# Patient Record
Sex: Female | Born: 1965
Health system: Southern US, Community
[De-identification: ages and names within clinical notes are randomized; demographics above are authoritative.]

## PROBLEM LIST (undated history)

## (undated) DIAGNOSIS — E7800 Pure hypercholesterolemia, unspecified: Secondary | ICD-10-CM

## (undated) DIAGNOSIS — I10 Essential (primary) hypertension: Secondary | ICD-10-CM

## (undated) DIAGNOSIS — F419 Anxiety disorder, unspecified: Secondary | ICD-10-CM

## (undated) HISTORY — PX: CHOLECYSTECTOMY: SHX55

---

## 2009-06-30 ENCOUNTER — Emergency Department (HOSPITAL_BASED_OUTPATIENT_CLINIC_OR_DEPARTMENT_OTHER): Admission: EM | Admit: 2009-06-30 | Discharge: 2009-06-30 | Payer: Self-pay | Admitting: Emergency Medicine

## 2009-06-30 ENCOUNTER — Ambulatory Visit: Payer: Self-pay | Admitting: Diagnostic Radiology

## 2010-05-25 LAB — COMPREHENSIVE METABOLIC PANEL WITH GFR
ALT: 283 U/L — ABNORMAL HIGH (ref 0–35)
AST: 193 U/L — ABNORMAL HIGH (ref 0–37)
Albumin: 4.5 g/dL (ref 3.5–5.2)
Alkaline Phosphatase: 90 U/L (ref 39–117)
BUN: 6 mg/dL (ref 6–23)
CO2: 28 meq/L (ref 19–32)
Calcium: 9.6 mg/dL (ref 8.4–10.5)
Chloride: 102 meq/L (ref 96–112)
Creatinine, Ser: 0.6 mg/dL (ref 0.4–1.2)
GFR calc non Af Amer: >60 mL/min
Glucose, Bld: 100 mg/dL — ABNORMAL HIGH (ref 70–99)
Potassium: 4.1 meq/L (ref 3.5–5.1)
Sodium: 143 meq/L (ref 135–145)
Total Bilirubin: 0.9 mg/dL (ref 0.3–1.2)
Total Protein: 8.9 g/dL — ABNORMAL HIGH (ref 6.0–8.3)

## 2010-05-25 LAB — DIFFERENTIAL
Basophils Absolute: 0.2 K/uL — ABNORMAL HIGH (ref 0.0–0.1)
Basophils Relative: 2 % — ABNORMAL HIGH (ref 0–1)
Eosinophils Absolute: 0.4 K/uL (ref 0.0–0.7)
Eosinophils Relative: 4 % (ref 0–5)
Lymphocytes Relative: 28 % (ref 12–46)
Lymphs Abs: 2.5 K/uL (ref 0.7–4.0)
Monocytes Absolute: 0.5 K/uL (ref 0.1–1.0)
Monocytes Relative: 6 % (ref 3–12)
Neutro Abs: 5.3 K/uL (ref 1.7–7.7)
Neutrophils Relative %: 60 % (ref 43–77)

## 2010-05-25 LAB — URINALYSIS, ROUTINE W REFLEX MICROSCOPIC
Bilirubin Urine: NEGATIVE
Glucose, UA: NEGATIVE mg/dL
Ketones, ur: NEGATIVE mg/dL
Urobilinogen, UA: 1 mg/dL (ref 0.0–1.0)
pH: 7.5 (ref 5.0–8.0)

## 2010-05-25 LAB — LIPASE, BLOOD: Lipase: 6969 U/L — ABNORMAL HIGH (ref 23–300)

## 2010-05-25 LAB — CBC
Platelets: UNDETERMINED 10*3/uL (ref 150–400)
WBC: 8.9 10*3/uL (ref 4.0–10.5)

## 2010-09-21 ENCOUNTER — Encounter: Payer: Self-pay | Admitting: Student

## 2010-09-21 ENCOUNTER — Emergency Department (HOSPITAL_BASED_OUTPATIENT_CLINIC_OR_DEPARTMENT_OTHER)
Admission: EM | Admit: 2010-09-21 | Discharge: 2010-09-21 | Disposition: A | Discharge status: Home / Self Care | Payer: BC Managed Care – PPO | Attending: Emergency Medicine | Admitting: Emergency Medicine

## 2010-09-21 DIAGNOSIS — R0602 Shortness of breath: Secondary | ICD-10-CM | POA: Insufficient documentation

## 2010-09-21 DIAGNOSIS — H9209 Otalgia, unspecified ear: Secondary | ICD-10-CM | POA: Insufficient documentation

## 2010-09-21 DIAGNOSIS — I10 Essential (primary) hypertension: Secondary | ICD-10-CM | POA: Insufficient documentation

## 2010-09-21 DIAGNOSIS — I889 Nonspecific lymphadenitis, unspecified: Secondary | ICD-10-CM | POA: Insufficient documentation

## 2010-09-21 DIAGNOSIS — H60399 Other infective otitis externa, unspecified ear: Secondary | ICD-10-CM

## 2010-09-21 DIAGNOSIS — J029 Acute pharyngitis, unspecified: Secondary | ICD-10-CM | POA: Insufficient documentation

## 2010-09-21 HISTORY — DX: Essential (primary) hypertension: I10

## 2010-09-21 HISTORY — DX: Anxiety disorder, unspecified: F41.9

## 2010-09-21 MED ORDER — NEOMYCIN-POLYMYXIN-HC 3.5-10000-1 OT SUSP: 3.0000 [drp] | Freq: Three times a day (TID) | OTIC | Status: AC

## 2010-09-21 MED ORDER — CEPHALEXIN 500 MG PO CAPS: 500.0000 mg | ORAL_CAPSULE | Freq: Three times a day (TID) | ORAL | Status: AC

## 2010-09-21 MED ORDER — HYDROCODONE-ACETAMINOPHEN 5-325 MG PO TABS: 1.0000 | ORAL_TABLET | ORAL | Status: AC | PRN

## 2010-09-21 MED ORDER — IBUPROFEN 800 MG PO TABS
800.0000 mg | ORAL_TABLET | Freq: Once | ORAL | Status: AC
  Administered 2010-09-21: 800 mg via ORAL
  Filled 2010-09-21: qty 1

## 2010-09-21 MED ORDER — CEPHALEXIN 250 MG PO CAPS
500.0000 mg | ORAL_CAPSULE | Freq: Once | ORAL | Status: AC
  Administered 2010-09-21: 500 mg via ORAL
  Filled 2010-09-21: qty 2

## 2010-09-21 MED ORDER — CEPHALEXIN 500 MG PO CAPS: 500.0000 mg | ORAL_CAPSULE | Freq: Four times a day (QID) | ORAL | Status: DC

## 2010-09-21 NOTE — ED Notes (Signed)
Pt in with c/o Right ear ache , pain , pressure radiating to right side of next with associated SOB and possible anxiety and palpitations x 1 week off and on. Denies N V D CP LOC

## 2010-09-21 NOTE — ED Provider Notes (Signed)
History     Chief Complaint  Patient presents with  . Shortness of Breath  . Otalgia    right ear   Patient is a 45 y.o. female presenting with ear pain. The history is provided by the patient.  Otalgia This is a new problem. The current episode started 2 days ago. There is pain in the right ear. The problem occurs constantly. The problem has not changed since onset.There has been no fever. The pain is at a severity of 6/10. The pain is moderate. Associated symptoms include sore throat. Pertinent negatives include no ear discharge, no headaches, no hearing loss, no rhinorrhea and no cough. Associated symptoms comments: Pain radiates to the right side of the neck. She had a brief episode of dyspnea initially, but none since two years ago..    Past Medical History  Diagnosis Date  . Anxiety   . Hypertension     Past Surgical History  Procedure Date  . Cholecystectomy     History reviewed. No pertinent family history.  History  Substance Use Topics  . Smoking status: Never Smoker   . Smokeless tobacco: Not on file  . Alcohol Use: No    OB History    Grav Para Term Preterm Abortions TAB SAB Ect Mult Living                  Review of Systems  HENT: Positive for ear pain and sore throat. Negative for hearing loss, rhinorrhea and ear discharge.   Respiratory: Positive for shortness of breath. Negative for cough.   Neurological: Negative for headaches.  All other systems reviewed and are negative.    Physical Exam  BP 133/99  Pulse 78  Temp(Src) 99.4 F (37.4 C) (Oral)  Resp 20  Wt 155 lb (70.308 kg)  SpO2 100%  Physical Exam  Nursing note and vitals reviewed. Constitutional: She is oriented to person, place, and time. She appears well-developed and well-nourished.  HENT:  Head: Normocephalic and atraumatic.  Left Ear: External ear normal.  Mouth/Throat: Oropharynx is clear and moist.       There is pain elicited when tension is applied to the helix of the  right ear. There is mild preauricular and postauricular tenderness. The right external auditory canal is mildly erythematous, right TM is normal.  Eyes: Conjunctivae and EOM are normal. Pupils are equal, round, and reactive to light.  Neck: Normal range of motion. Neck supple.       Mild right posterior cervical adenopathy.  Cardiovascular: Normal rate and normal heart sounds.   No murmur heard. Abdominal: Soft. Bowel sounds are normal. She exhibits no mass. There is no tenderness.  Musculoskeletal: Normal range of motion. She exhibits no edema.  Lymphadenopathy:    She has cervical adenopathy.  Neurological: She is alert and oriented to person, place, and time. A cranial nerve deficit is present. Coordination normal.  Skin: Skin is warm and dry. No rash noted.  Psychiatric: She has a normal mood and affect.    ED Course  Procedures  MDM Clinically mild otitis externa.      Dione Booze, MD 09/21/10 2258

## 2015-08-02 ENCOUNTER — Emergency Department (HOSPITAL_BASED_OUTPATIENT_CLINIC_OR_DEPARTMENT_OTHER): Payer: BC Managed Care – PPO

## 2015-08-02 ENCOUNTER — Emergency Department (HOSPITAL_BASED_OUTPATIENT_CLINIC_OR_DEPARTMENT_OTHER)
Admission: EM | Admit: 2015-08-02 | Discharge: 2015-08-02 | Disposition: A | Discharge status: Home / Self Care | Payer: BC Managed Care – PPO | Attending: Emergency Medicine | Admitting: Emergency Medicine

## 2015-08-02 ENCOUNTER — Encounter (HOSPITAL_BASED_OUTPATIENT_CLINIC_OR_DEPARTMENT_OTHER): Payer: Self-pay | Admitting: Emergency Medicine

## 2015-08-02 DIAGNOSIS — Z79899 Other long term (current) drug therapy: Secondary | ICD-10-CM | POA: Diagnosis not present

## 2015-08-02 DIAGNOSIS — R1031 Right lower quadrant pain: Secondary | ICD-10-CM

## 2015-08-02 DIAGNOSIS — I10 Essential (primary) hypertension: Secondary | ICD-10-CM | POA: Insufficient documentation

## 2015-08-02 LAB — CBC
HCT: 37.2 % (ref 36.0–46.0)
HEMOGLOBIN: 12.7 g/dL (ref 12.0–15.0)
MCH: 30.3 pg (ref 26.0–34.0)
MCHC: 34.1 g/dL (ref 30.0–36.0)
MCV: 88.8 fL (ref 78.0–100.0)
Platelets: ADEQUATE 10*3/uL (ref 150–400)
RBC: 4.19 MIL/uL (ref 3.87–5.11)
RDW: 12.5 % (ref 11.5–15.5)
WBC: 6.7 10*3/uL (ref 4.0–10.5)

## 2015-08-02 LAB — URINALYSIS, ROUTINE W REFLEX MICROSCOPIC
BILIRUBIN URINE: NEGATIVE
GLUCOSE, UA: NEGATIVE mg/dL
HGB URINE DIPSTICK: NEGATIVE
Ketones, ur: NEGATIVE mg/dL
Leukocytes, UA: NEGATIVE
NITRITE: NEGATIVE
Protein, ur: NEGATIVE mg/dL
SPECIFIC GRAVITY, URINE: 1.016 (ref 1.005–1.030)
pH: 5.5 (ref 5.0–8.0)

## 2015-08-02 LAB — COMPREHENSIVE METABOLIC PANEL
ALK PHOS: 99 U/L (ref 38–126)
ALT: 38 U/L (ref 14–54)
ANION GAP: 10 (ref 5–15)
AST: 31 U/L (ref 15–41)
Albumin: 4.4 g/dL (ref 3.5–5.0)
BILIRUBIN TOTAL: 0.6 mg/dL (ref 0.3–1.2)
BUN: 11 mg/dL (ref 6–20)
CALCIUM: 9.8 mg/dL (ref 8.9–10.3)
CO2: 28 mmol/L (ref 22–32)
Chloride: 99 mmol/L — ABNORMAL LOW (ref 101–111)
Creatinine, Ser: 0.59 mg/dL (ref 0.44–1.00)
Glucose, Bld: 94 mg/dL (ref 65–99)
Potassium: 3.8 mmol/L (ref 3.5–5.1)
SODIUM: 137 mmol/L (ref 135–145)
TOTAL PROTEIN: 8.5 g/dL — AB (ref 6.5–8.1)

## 2015-08-02 LAB — PREGNANCY, URINE: PREG TEST UR: NEGATIVE

## 2015-08-02 LAB — WET PREP, GENITAL
Sperm: NONE SEEN
Trich, Wet Prep: NONE SEEN
Yeast Wet Prep HPF POC: NONE SEEN

## 2015-08-02 LAB — LIPASE, BLOOD: Lipase: 18 U/L (ref 11–51)

## 2015-08-02 MED ORDER — IOPAMIDOL (ISOVUE-300) INJECTION 61%
100.0000 mL | Freq: Once | INTRAVENOUS | Status: AC | PRN
  Administered 2015-08-02: 100 mL via INTRAVENOUS

## 2015-08-02 MED ORDER — MORPHINE SULFATE (PF) 4 MG/ML IV SOLN
4.0000 mg | Freq: Once | INTRAVENOUS | Status: DC
  Filled 2015-08-02: qty 1

## 2015-08-02 MED ORDER — METRONIDAZOLE 500 MG PO TABS: 500.0000 mg | ORAL_TABLET | Freq: Two times a day (BID) | ORAL | Status: AC

## 2015-08-02 MED ORDER — ONDANSETRON HCL 4 MG/2ML IJ SOLN
4.0000 mg | Freq: Once | INTRAMUSCULAR | Status: DC
  Filled 2015-08-02: qty 2

## 2015-08-02 MED ORDER — SODIUM CHLORIDE 0.9 % IV BOLUS (SEPSIS)
1000.0000 mL | Freq: Once | INTRAVENOUS | Status: AC
  Administered 2015-08-02: 1000 mL via INTRAVENOUS

## 2015-08-02 MED ORDER — IOPAMIDOL (ISOVUE-300) INJECTION 61%: 100.0000 mL | Freq: Once | INTRAVENOUS | Status: DC | PRN

## 2015-08-02 NOTE — ED Provider Notes (Signed)
Complains of nonradiating dull right lower quadrant abdominal pain gradual onset 3 days ago. Pain is improved with eating however she does have diminished appetite for the past 3 days. Pain not made worse with anything She denies fever denies nausea or vomiting pain is mild at present she denies vaginal discharge denies urinary symptoms. On exam alert no distress lungs clear auscultation heart regular rate and rhythm abdomen nondistended normal active bowel sounds tender at right lower quadrant without guarding rigidity or rebound  Doug SouSam Thresea Doble, MD 08/02/15 1535

## 2015-08-02 NOTE — ED Provider Notes (Signed)
CSN: [id number]     Arrival date & time 08/02/15  1354 History   First MD Initiated Contact with Patient 08/02/15 1436     Chief Complaint  Patient presents with  . right side pain    HPI  Crystal Ross is a 50 y.o. female PMH significant for anxiety, HTN presenting with RLQ pain since Thursday. She describes her pain as intermittent, 5/10 pain scale, radiating intermittently to periumbilical, dull. She admits to decreased appetite. Pain improved with laying on either of her sides and eating. She denies fevers, chills, N/V, changes in bowel/bladder habits, urinary/vaginal complaints.   Past Medical History  Diagnosis Date  . Anxiety   . Hypertension    Past Surgical History  Procedure Laterality Date  . Cholecystectomy     History reviewed. No pertinent family history. Social History  Substance Use Topics  . Smoking status: Never Smoker   . Smokeless tobacco: None  . Alcohol Use: No   OB History    No data available     Review of Systems  Ten systems are reviewed and are negative for acute change except as noted in the HPI  Allergies  Review of patient's allergies indicates no known allergies.  Home Medications   Prior to Admission medications   Medication Sig Start Date End Date Taking? Authorizing Provider  valsartan-hydrochlorothiazide (DIOVAN-HCT) 160-12.5 MG per tablet Take 1 tablet by mouth daily.      Historical Provider, MD   BP 152/96 mmHg  Pulse 75  Temp(Src) 98.8 F (37.1 C) (Oral)  Resp 18  Ht  (1.626 m)  Wt 78.019 kg  BMI 29.51 kg/m2  SpO2 100% Physical Exam  Constitutional: She appears well-developed and well-nourished. No distress.  HENT:  Head: Normocephalic and atraumatic.  Mouth/Throat: Oropharynx is clear and moist. No oropharyngeal exudate.  Eyes: Conjunctivae are normal. Pupils are equal, round, and reactive to light. Right eye exhibits no discharge. Left eye exhibits no discharge. No scleral icterus.  Neck: No tracheal deviation  present.  Cardiovascular: Normal rate, regular rhythm, normal heart sounds and intact distal pulses.  Exam reveals no gallop and no friction rub.   No murmur heard. Pulmonary/Chest: Effort normal and breath sounds normal. No respiratory distress. She has no wheezes. She has no rales. She exhibits no tenderness.  Abdominal: Soft. Bowel sounds are normal. She exhibits no distension and no mass. There is tenderness. There is guarding. There is no rebound.  Positive McBurneys and Rosvings.   Genitourinary:  Chaperoned pelvic exam: normal external genitalia, vulva, vagina, cervix, uterus and adnexa.   Musculoskeletal: She exhibits no edema.  Lymphadenopathy:    She has no cervical adenopathy.  Neurological: She is alert. Coordination normal.  Skin: Skin is warm and dry. No rash noted. She is not diaphoretic. No erythema.  Psychiatric: She has a normal mood and affect. Her behavior is normal.  Nursing note and vitals reviewed.  ED Course  Procedures  Labs Review Labs Reviewed  COMPREHENSIVE METABOLIC PANEL - Abnormal; Notable for the following:    Chloride 99 (*)    Total Protein 8.5 (*)    All other components within normal limits  WET PREP, GENITAL  LIPASE, BLOOD  CBC  URINALYSIS, ROUTINE W REFLEX MICROSCOPIC (NOT AT [facility])  PREGNANCY, URINE  GC/CHLAMYDIA PROBE AMP ([place]) NOT AT [facility]   Imaging Review Ct Abdomen Pelvis W Contrast  08/02/2015  CLINICAL DATA:  Right lower  quadrant tenderness with palpation. EXAM: CT ABDOMEN AND PELVIS WITH CONTRAST  TECHNIQUE: Multidetector CT imaging of the abdomen and pelvis was performed using the standard protocol following bolus administration of intravenous contrast. CONTRAST:  100mL ISOVUE-300 IOPAMIDOL (ISOVUE-300) INJECTION 61% COMPARISON:  Abdominal ultrasound of 06/30/2009. FINDINGS: Lower chest: Clear lung bases. Mild cardiomegaly, without pericardial or pleural effusion. Hepatobiliary: There may be mild hepatic steatosis. No focal liver  lesion. Variant lateral segment left liver lobe extends left upper quadrant. Cholecystectomy, without biliary ductal dilatation. Pancreas: Normal, without mass or ductal dilatation. Spleen: Normal in size, without focal abnormality. Adrenals/Urinary Tract: Normal adrenal glands. Normal kidneys, without hydronephrosis. Normal urinary bladder. Stomach/Bowel: Normal stomach, without wall thickening. Normal colon, appendix, and terminal ileum. Normal small bowel. Vascular/Lymphatic: Aortic and branch vessel atherosclerosis. No abdominopelvic adenopathy. Reproductive: Normal uterus and adnexa. Other: No significant free fluid. Musculoskeletal: No acute osseous abnormality. Mild convex left lumbar spine curvature. IMPRESSION: No acute process or explanation for right lower quadrant pain. Electronically Signed   By: Jeronimo GreavesKyle  Talbot M.D.   On: 08/02/2015 16:51   I have personally reviewed and evaluated these images and lab results as part of my medical decision-making.  MDM   Final diagnoses:  RLQ abdominal pain   Patient nontoxic appearing, VSS.  CT abd/pelv, lipase, CMP, CBC, UA, hcg unremarkable. Less likely etiologies include appendicitis, Meckel's diverticulum, ruptured ectopic pregnancy, ovarian torsion, ovarian cyst, PID/TOA, kidney stone, psoas abscess. Bacterial vaginosis on wet prep. GC/Chlamydia test still pending. Pelvic exam was unremarkable for adnexal tenderness. Will hold off on treating empirically, as patient denies being sexually active and denies any vaginal or urinary complaints. Patient may be safely discharged home. Discussed reasons for return. Patient to follow-up with primary care provider within one week. Patient in understanding and agreement with the plan.     Melton KrebsSamantha Nicole Ramsay Bognar, PA-C 08/02/15 1729  Doug SouSam Jacubowitz, MD 08/04/15 [phone]

## 2015-08-02 NOTE — ED Notes (Signed)
Pt c/o RLQ pain since Thursday, states pain is nonradiating and describes pain as dull, denies urinary symptoms, denies N/V, denies vaginal discharge. Pt states pain gets worse when she eats.

## 2015-08-02 NOTE — ED Notes (Signed)
Patient states that she has had had a pain and a bump to her right side  / abdominal / pelvic region since Thursday. Denies any N/V/D

## 2015-08-02 NOTE — Discharge Instructions (Signed)
Ms. Crystal Ross,  Nice meeting you! Please follow-up with your primary care provider. Return to the emergency department if you develop fevers, chills, increased abdominal pain, nausea/vomiting, new/worsening symptoms. Feel better soon!  S. Lane HackerNicole Seth Higginbotham, PA-C Abdominal Pain, Adult Many things can cause abdominal pain. Usually, abdominal pain is not caused by a disease and will improve without treatment. It can often be observed and treated at home. Your health care provider will do a physical exam and possibly order blood tests and X-rays to help determine the seriousness of your pain. However, in many cases, more time must pass before a clear cause of the pain can be found. Before that point, your health care provider may not know if you need more testing or further treatment. HOME CARE INSTRUCTIONS Monitor your abdominal pain for any changes. The following actions may help to alleviate any discomfort you are experiencing:  Only take over-the-counter or prescription medicines as directed by your health care provider.  Do not take laxatives unless directed to do so by your health care provider.  Try a clear liquid diet (broth, tea, or water) as directed by your health care provider. Slowly move to a bland diet as tolerated. SEEK MEDICAL CARE IF:  You have unexplained abdominal pain.  You have abdominal pain associated with nausea or diarrhea.  You have pain when you urinate or have a bowel movement.  You experience abdominal pain that wakes you in the night.  You have abdominal pain that is worsened or improved by eating food.  You have abdominal pain that is worsened with eating fatty foods.  You have a fever. SEEK IMMEDIATE MEDICAL CARE IF:  Your pain does not go away within 2 hours.  You keep throwing up (vomiting).  Your pain is felt only in portions of the abdomen, such as the right side or the left lower portion of the abdomen.  You pass bloody or black tarry  stools. MAKE SURE YOU:  Understand these instructions.  Will watch your condition.  Will get help right away if you are not doing well or get worse.   This information is not intended to replace advice given to you by your health care provider. Make sure you discuss any questions you have with your health care provider.   Document Released: 12/01/2004 Document Revised: 11/12/2014 Document Reviewed: 10/31/2012 Elsevier Interactive Patient Education [employer]2016 Elsevier Inc.

## 2015-08-04 LAB — GC/CHLAMYDIA PROBE AMP (~~LOC~~) NOT AT ARMC
Chlamydia: NEGATIVE
Neisseria Gonorrhea: NEGATIVE

## 2016-05-05 ENCOUNTER — Encounter (HOSPITAL_BASED_OUTPATIENT_CLINIC_OR_DEPARTMENT_OTHER): Payer: Self-pay | Admitting: Emergency Medicine

## 2016-05-05 ENCOUNTER — Emergency Department (HOSPITAL_BASED_OUTPATIENT_CLINIC_OR_DEPARTMENT_OTHER): Payer: BC Managed Care – PPO

## 2016-05-05 ENCOUNTER — Emergency Department (HOSPITAL_BASED_OUTPATIENT_CLINIC_OR_DEPARTMENT_OTHER)
Admission: EM | Admit: 2016-05-05 | Discharge: 2016-05-05 | Disposition: A | Discharge status: Home / Self Care | Payer: BC Managed Care – PPO | Source: Home / Self Care

## 2016-05-05 ENCOUNTER — Emergency Department (HOSPITAL_BASED_OUTPATIENT_CLINIC_OR_DEPARTMENT_OTHER)
Admission: EM | Admit: 2016-05-05 | Discharge: 2016-05-05 | Disposition: A | Discharge status: Home / Self Care | Payer: BC Managed Care – PPO | Attending: Emergency Medicine | Admitting: Emergency Medicine

## 2016-05-05 DIAGNOSIS — R0789 Other chest pain: Secondary | ICD-10-CM | POA: Diagnosis not present

## 2016-05-05 DIAGNOSIS — Z79899 Other long term (current) drug therapy: Secondary | ICD-10-CM

## 2016-05-05 DIAGNOSIS — Z5321 Procedure and treatment not carried out due to patient leaving prior to being seen by health care provider: Secondary | ICD-10-CM

## 2016-05-05 DIAGNOSIS — I10 Essential (primary) hypertension: Secondary | ICD-10-CM | POA: Insufficient documentation

## 2016-05-05 DIAGNOSIS — R079 Chest pain, unspecified: Secondary | ICD-10-CM | POA: Insufficient documentation

## 2016-05-05 HISTORY — DX: Pure hypercholesterolemia, unspecified: E78.00

## 2016-05-05 LAB — CBC
HCT: 36.3 % (ref 36.0–46.0)
Hemoglobin: 12.3 g/dL (ref 12.0–15.0)
MCH: 30.3 pg (ref 26.0–34.0)
MCHC: 33.9 g/dL (ref 30.0–36.0)
MCV: 89.4 fL (ref 78.0–100.0)
PLATELETS: 357 10*3/uL (ref 150–400)
RBC: 4.06 MIL/uL (ref 3.87–5.11)
RDW: 12.3 % (ref 11.5–15.5)
WBC: 5.7 10*3/uL (ref 4.0–10.5)

## 2016-05-05 LAB — BASIC METABOLIC PANEL
Anion gap: 6 (ref 5–15)
BUN: 10 mg/dL (ref 6–20)
CHLORIDE: 100 mmol/L — AB (ref 101–111)
CO2: 33 mmol/L — AB (ref 22–32)
CREATININE: 0.67 mg/dL (ref 0.44–1.00)
Calcium: 9.4 mg/dL (ref 8.9–10.3)
GFR calc Af Amer: >60 mL/min (ref >60)
GFR calc non Af Amer: >60 mL/min (ref >60)
GLUCOSE: 98 mg/dL (ref 65–99)
Potassium: 3.8 mmol/L (ref 3.5–5.1)
Sodium: 139 mmol/L (ref 135–145)

## 2016-05-05 LAB — D-DIMER, QUANTITATIVE: D-Dimer, Quant: 2.05 ug/mL-FEU — ABNORMAL HIGH (ref 0.00–0.50)

## 2016-05-05 LAB — TROPONIN I: Troponin I: <0.03 ng/mL (ref <0.03)

## 2016-05-05 MED ORDER — KETOROLAC TROMETHAMINE 30 MG/ML IJ SOLN
30.0000 mg | Freq: Once | INTRAMUSCULAR | Status: AC
  Administered 2016-05-05: 30 mg via INTRAVENOUS
  Filled 2016-05-05: qty 1

## 2016-05-05 MED ORDER — IOPAMIDOL (ISOVUE-370) INJECTION 76%
100.0000 mL | Freq: Once | INTRAVENOUS | Status: AC | PRN
  Administered 2016-05-05: 100 mL via INTRAVENOUS

## 2016-05-05 MED ORDER — NAPROXEN 500 MG PO TABS: 500.0000 mg | ORAL_TABLET | Freq: Two times a day (BID) | ORAL | 0 refills | Status: AC

## 2016-05-05 MED ORDER — HYDROCODONE-ACETAMINOPHEN 5-325 MG PO TABS: 1.0000 | ORAL_TABLET | Freq: Four times a day (QID) | ORAL | 0 refills | Status: AC | PRN

## 2016-05-05 MED FILL — HYDROCODON-APAP 5-325: 5-325 | 2 days supply | Qty: 10 | Fill #0

## 2016-05-05 MED FILL — NAPROXEN 500 MG TABLET: 500 | 8 days supply | Qty: 15 | Fill #0

## 2016-05-05 NOTE — ED Notes (Signed)
Patient transported to X-ray 

## 2016-05-05 NOTE — ED Triage Notes (Signed)
Pt with continued chest pain. Recently seen here this morning after full workup states "the pain is just there, when I took the pain medication the first time it eased off."  Denies N/V. A/O at triage NAD.

## 2016-05-05 NOTE — Discharge Instructions (Signed)
Naproxen as prescribed. Hydrococone as prescribed as needed for pain not relieved with naproxen.  Return to the emergency department if you develop worsening pain, difficulty breathing, high fevers, productive cough, or other new and concerning symptoms.

## 2016-05-05 NOTE — ED Notes (Signed)
ED Provider at bedside. 

## 2016-05-05 NOTE — ED Provider Notes (Signed)
[facility] Provider [facility]   CSN: [id number] Arrival date & time: 05/05/16  0712     History   Chief Complaint Chief Complaint  Patient presents with  . Chest Pain    HPI Crystal Ross is a 51 y.o. female.  Patient is a 51 year old female with history of hypertension. She presents for evaluation of chest discomfort. This started 2 days ago in the absence of any injury or trauma. Her pain is sharp in nature and located in the front of her chest. It radiates around to the left side and under her left breast. It is worse with deep breathing and relieved with rest. She denies any fevers, chills, or cough.  She has no prior cardiac history, but does report having had a prior stress test years ago which was unremarkable.   The history is provided by the patient.  Chest Pain   This is a new problem. The current episode started 2 days ago. The problem occurs constantly. The problem has not changed since onset.The pain is associated with movement and breathing. The pain is present in the lateral region and substernal region. The pain is moderate. The quality of the pain is described as sharp. The pain does not radiate. The symptoms are aggravated by deep breathing. Pertinent negatives include no cough, no diaphoresis, no fever, no palpitations, no shortness of breath and no sputum production. She has tried nothing for the symptoms. The treatment provided no relief.    Past Medical History:  Diagnosis Date  . Anxiety   . Hypercholesteremia   . Hypertension     There are no active problems to display for this patient.   Past Surgical History:  Procedure Laterality Date  . CHOLECYSTECTOMY      OB History    No data available       Home Medications    Prior to Admission medications   Medication Sig Start Date End Date Taking? Authorizing Provider  metroNIDAZOLE (FLAGYL) 500 MG tablet Take 1 tablet (500 mg total) by mouth 2 (two) times daily. 08/02/15   Melton KrebsSamantha  Nicole Riley, PA-C  valsartan-hydrochlorothiazide (DIOVAN-HCT) 160-12.5 MG per tablet Take 1 tablet by mouth daily.      Historical Provider, MD    Family History History reviewed. No pertinent family history.  Social History Social History  Substance Use Topics  . Smoking status: Never Smoker  . Smokeless tobacco: Never Used  . Alcohol use No     Allergies   Patient has no known allergies.   Review of Systems Review of Systems  Constitutional: Negative for diaphoresis and fever.  Respiratory: Negative for cough, sputum production and shortness of breath.   Cardiovascular: Positive for chest pain. Negative for palpitations.  All other systems reviewed and are negative.    Physical Exam Updated Vital Signs There were no vitals taken for this visit.  Physical Exam  Constitutional: She is oriented to person, place, and time. She appears well-developed and well-nourished. No distress.  HENT:  Head: Normocephalic and atraumatic.  Neck: Normal range of motion. Neck supple.  Cardiovascular: Normal rate and regular rhythm.  Exam reveals no gallop and no friction rub.   No murmur heard. Pulmonary/Chest: Effort normal and breath sounds normal. No respiratory distress. She has no wheezes.  Abdominal: Soft. Bowel sounds are normal. She exhibits no distension. There is no tenderness.  Musculoskeletal: Normal range of motion. She exhibits no edema.  There is swelling or tenderness. Homans sign is absent bilaterally.  Neurological:  She is alert and oriented to person, place, and time.  Skin: Skin is warm and dry. She is not diaphoretic.  Nursing note and vitals reviewed.    ED Treatments / Results  Labs (all labs ordered are listed, but only abnormal results are displayed) Labs Reviewed  BASIC METABOLIC PANEL  CBC  TROPONIN I  D-DIMER, QUANTITATIVE (NOT AT [facility])    EKG  EKG Interpretation  Date/Time:  Thursday May 05 2016 07:24:30 EST Ventricular Rate:  77 PR  Interval:    QRS Duration: 100 QT Interval:  418 QTC Calculation: 474 R Axis:   -78 Text Interpretation:  Sinus rhythm LAE, consider biatrial enlargement Left anterior fascicular block Abnormal R-wave progression, late transition Nonspecific T abnrm, anterolateral leads Baseline wander in lead(s) III V6 Confirmed by Langston Tuberville  MD, Merlinda Wrubel ([id number]) on 05/05/2016 7:36:33 AM       Radiology No results found.  Procedures Procedures (including critical care time)  Medications Ordered in ED Medications  ketorolac (TORADOL) 30 MG/ML injection 30 mg (not administered)     Initial Impression / Assessment and Plan / ED Course  I have reviewed the triage vital signs and the nursing notes.  Pertinent labs & imaging results that were available during my care of the patient were reviewed by me and considered in my medical decision making (see chart for details).  Patient presents with complaints of pleuritic left-sided chest pain that started yesterday. Her d-dimer is elevated, however there is no evidence for pulmonary embolism on her CT scan. Cardiac workup was also unremarkable. I found no emergent cause of her discomfort and I suspect this is most likely musculoskeletal in nature. She will be treated with naproxen, pain medication, and is to follow-up as needed if not improving. To return if worsening.  Final Clinical Impressions(s) / ED Diagnoses   Final diagnoses:  None    New Prescriptions New Prescriptions   No medications on file     Geoffery Lyons, MD 05/05/16 [phone]

## 2016-05-05 NOTE — ED Notes (Signed)
ED Provider at bedside to discuss findings and discharge instructions.

## 2016-05-05 NOTE — ED Triage Notes (Signed)
Pt having central chest pain radiating to left breast to back and left shoulder for two days.  Some difficulty taking a deep breath due to pain.  Pt had some dizziness and diaphoresis yesterday.

## 2016-05-05 NOTE — ED Notes (Signed)
Per registration, pt is leaving and is going to follow up with her PCP tomorrow.

## 2018-08-13 IMAGING — CT CT ANGIO CHEST
2 of 8 series · 18 of 36 positions shown · IV contrast (isovue)
Comparison: Chest radiographs 4884 hours today. CT Abdomen and
Pelvis 08/02/2015

CLINICAL DATA: 50-year-old female with central chest pain radiating
to the left breast, back and left shoulder for 2 days. Pleuritic
pain. Dizziness and diaphoresis yesterday. Initial encounter.

EXAM:
CT ANGIOGRAPHY CHEST WITH CONTRAST
TECHNIQUE: Multidetector CT imaging of the chest was performed using the
standard protocol during bolus administration of intravenous
contrast. Multiplanar CT image reconstructions and MIPs were
obtained to evaluate the vascular anatomy.
CONTRAST:  80 mL Isovue 370

[Series 7: pe coronal mpr · coronal · 0.56mm/px · 1 of 110 slices shown]
[im 55/110  mediastinal]
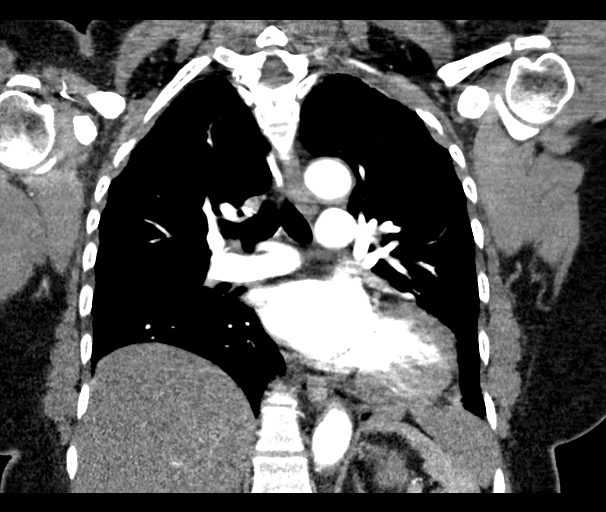

[Series 11: pe thins · axial · 0.80mm/px · z∈[-228,+10]mm · 17 of 266 slices shown]
[im 14/266  lung]
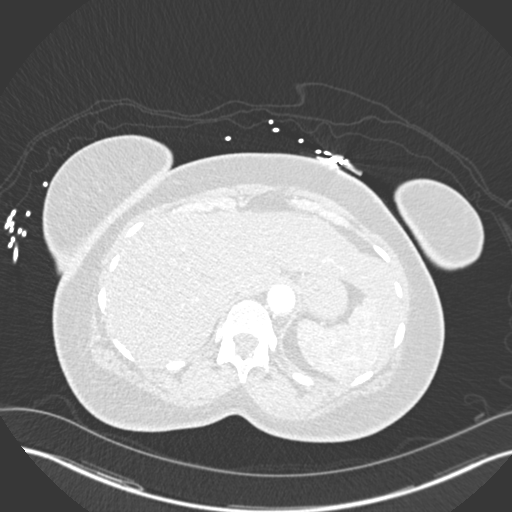
[im 28/266  mediastinal]
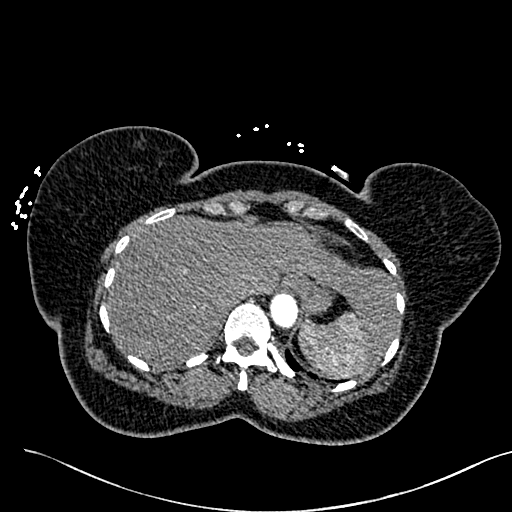
[im 42/266  lung]
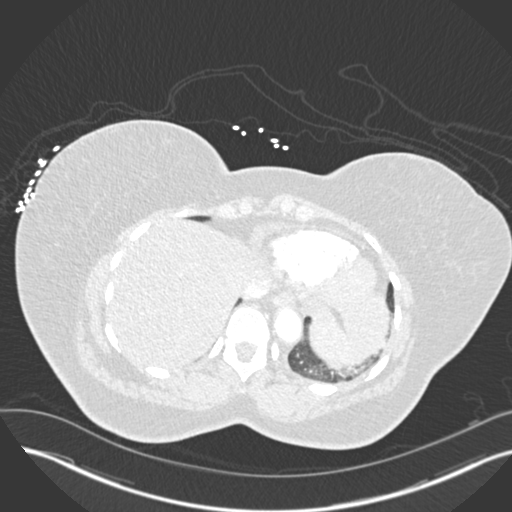
[im 56/266  mediastinal]
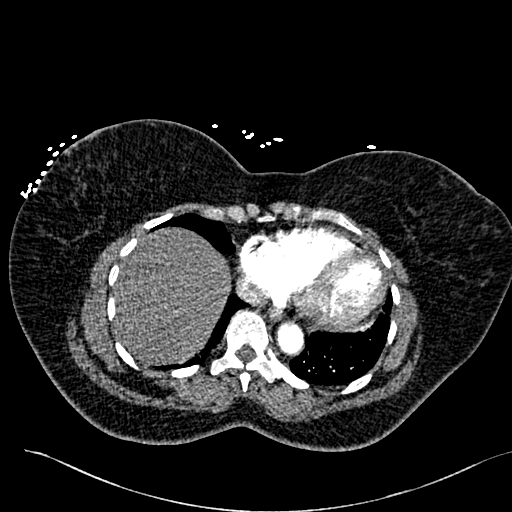
[im 70/266  lung]
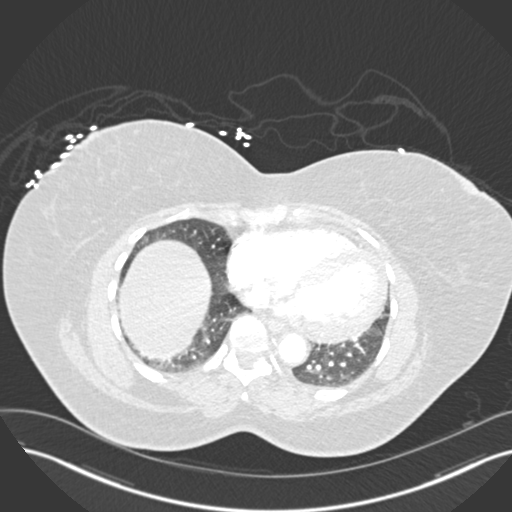
[im 84/266  mediastinal]
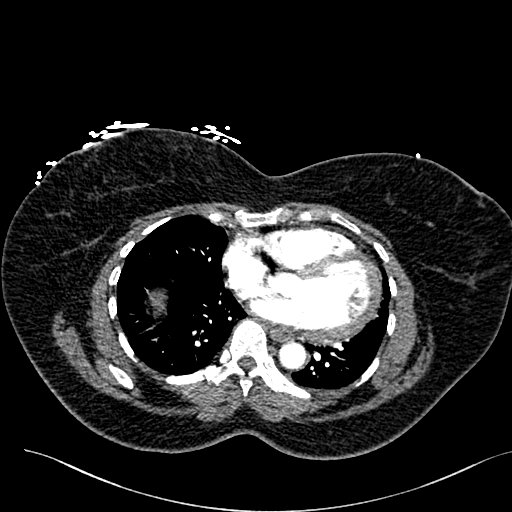
[im 98/266  lung]
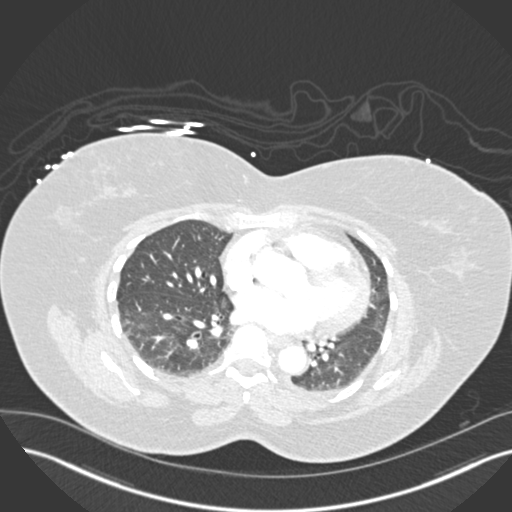
[im 112/266  mediastinal]
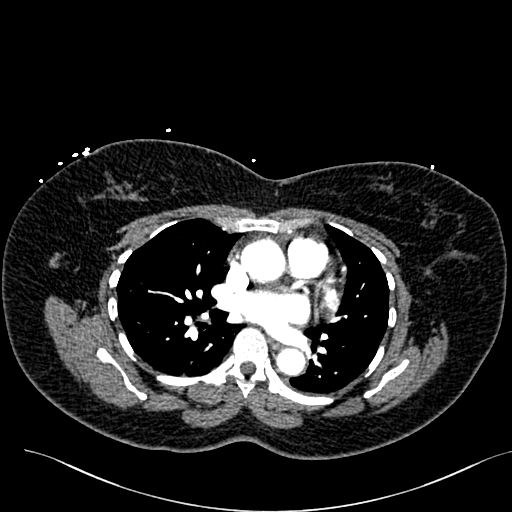
[im 140/266  lung]
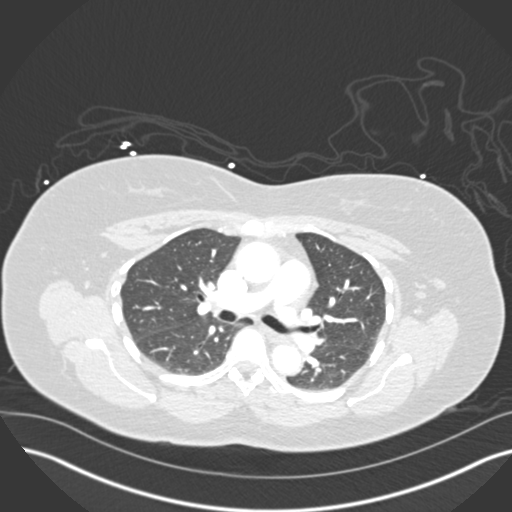
[im 154/266  mediastinal]
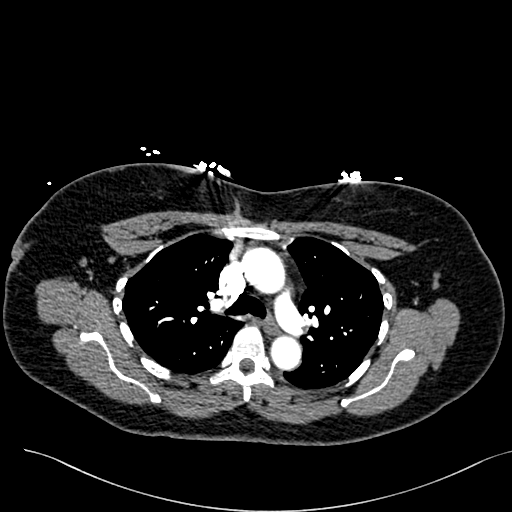
[im 168/266  lung]
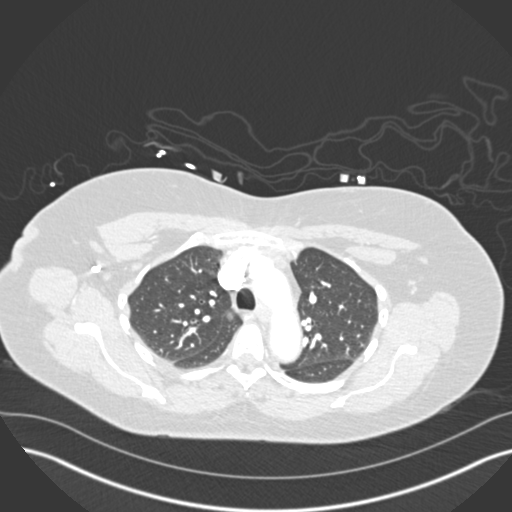
[im 182/266  mediastinal]
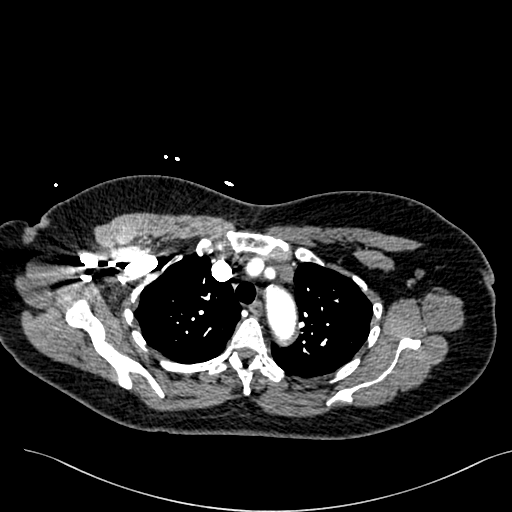
[im 196/266  lung]
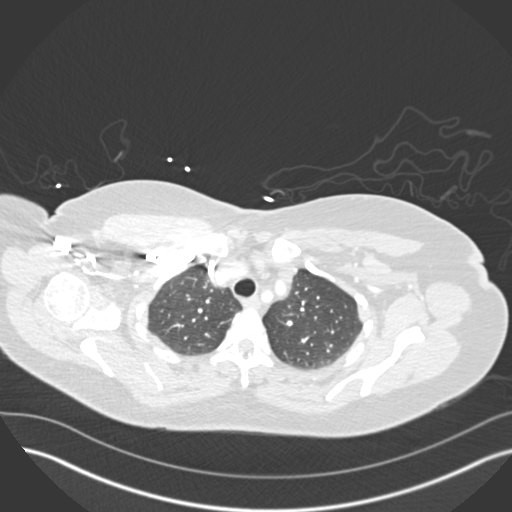
[im 210/266  mediastinal]
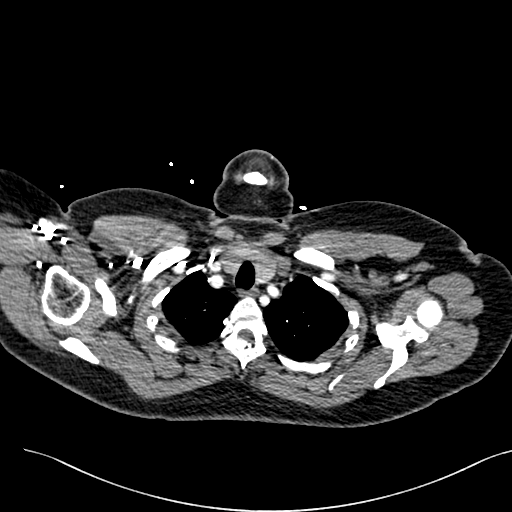
[im 224/266  lung]
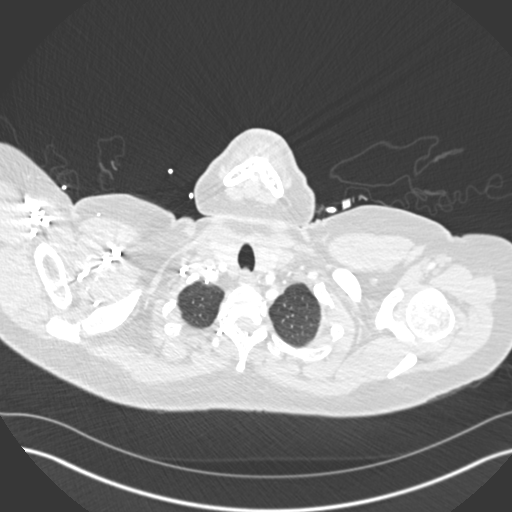
[im 238/266  mediastinal]
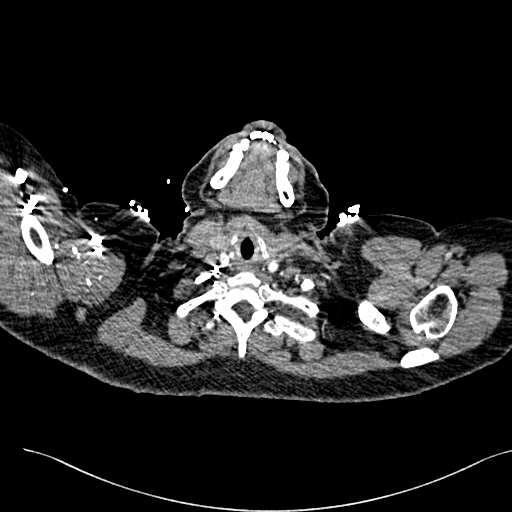
[im 252/266  lung]
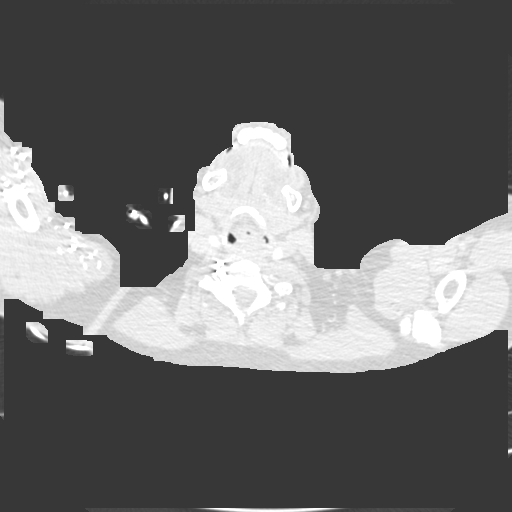

[18 of 36 positions shown; findings below may reference images not displayed]

FINDINGS: Cardiovascular: Good contrast bolus timing in the pulmonary arterial
tree.

No focal filling defect identified in the pulmonary arteries to
suggest acute pulmonary embolism.

Negative visible aorta. No calcified coronary artery atherosclerosis
is evident. Mild cardiomegaly (series 5, image 63), but no
pericardial effusion.

Mediastinum/Nodes: Negative.  No lymphadenopathy.

Negative visualized thoracic inlet and lower neck. No axillary
lymphadenopathy.

Lungs/Pleura: Low lung volumes with mild perihilar atelectasis.
Major airways are patent. Mild atelectasis at both lung bases. No
pleural effusion or other abnormal pulmonary opacity.

Upper Abdomen: Stable visualized upper abdominal viscera including
somewhat elongated left hepatic lobe of the liver (normal variant).

Musculoskeletal: Incidental paravertebral venous reflux of contrast
in the upper thoracic spine (series 8, image 85). Minimal levoconvex
upper thoracic scoliosis. Otherwise normal visualized osseous
structures.

Review of the MIP images confirms the above findings.
IMPRESSION: No evidence of acute pulmonary embolus. Mild cardiomegaly. No acute
findings in the chest aside from pulmonary atelectasis.

## 2022-03-14 ENCOUNTER — Other Ambulatory Visit: Payer: Self-pay

## 2022-03-14 ENCOUNTER — Encounter (HOSPITAL_BASED_OUTPATIENT_CLINIC_OR_DEPARTMENT_OTHER): Payer: Self-pay | Admitting: Urology

## 2022-03-14 ENCOUNTER — Emergency Department (HOSPITAL_BASED_OUTPATIENT_CLINIC_OR_DEPARTMENT_OTHER): Payer: BC Managed Care – PPO

## 2022-03-14 ENCOUNTER — Emergency Department (HOSPITAL_BASED_OUTPATIENT_CLINIC_OR_DEPARTMENT_OTHER)
Admission: EM | Admit: 2022-03-14 | Discharge: 2022-03-14 | Disposition: A | Discharge status: Home / Self Care | Payer: BC Managed Care – PPO | Attending: Emergency Medicine | Admitting: Emergency Medicine

## 2022-03-14 DIAGNOSIS — R079 Chest pain, unspecified: Secondary | ICD-10-CM | POA: Diagnosis not present

## 2022-03-14 LAB — CBC
HCT: 37.5 % (ref 36.0–46.0)
Hemoglobin: 12.7 g/dL (ref 12.0–15.0)
MCH: 29.7 pg (ref 26.0–34.0)
MCHC: 33.9 g/dL (ref 30.0–36.0)
MCV: 87.8 fL (ref 80.0–100.0)
Platelets: 436 10*3/uL — ABNORMAL HIGH (ref 150–400)
RBC: 4.27 MIL/uL (ref 3.87–5.11)
RDW: 12.2 % (ref 11.5–15.5)
WBC: 7.7 10*3/uL (ref 4.0–10.5)
nRBC: 0 % (ref 0.0–0.2)

## 2022-03-14 LAB — BASIC METABOLIC PANEL
Anion gap: 10 (ref 5–15)
BUN: 11 mg/dL (ref 6–20)
CO2: 31 mmol/L (ref 22–32)
Calcium: 9.8 mg/dL (ref 8.9–10.3)
Chloride: 95 mmol/L — ABNORMAL LOW (ref 98–111)
Creatinine, Ser: 0.7 mg/dL (ref 0.44–1.00)
GFR, Estimated: >60 mL/min (ref >60)
Glucose, Bld: 116 mg/dL — ABNORMAL HIGH (ref 70–99)
Potassium: 3.2 mmol/L — ABNORMAL LOW (ref 3.5–5.1)
Sodium: 136 mmol/L (ref 135–145)

## 2022-03-14 LAB — TROPONIN I (HIGH SENSITIVITY): Troponin I (High Sensitivity): 3 ng/L (ref <18)

## 2022-03-14 NOTE — Discharge Instructions (Addendum)
You were seen today for chest pain. We did not identify any emergent cause for your symptoms. Your evaluation is most consistent with costochondritis.   Plan and next steps:   The following may be helpful in managing your symptoms:   Pain- Lidocaine Patches  Apply to affected area for up to 12 hours at a time.   Pain/Fever- Adult Tylenol dosing:  650 mg orally every 4 to 6 hours as needed, MAX: 3250 mg/24 hours   (Extra-strength) 1000 mg orally every 6 hours as needed; MAX: 3000 mg/24 hours   Do not use if you have liver disease. Read the label on the bottle.   Pain/Fever- Adult Ibuprofen Dosing  200 to 400 mg orally every 4 to 6 hours as needed; MAX 1200 mg/day; do not take longer than 10 days   Do not use if you have kidney disease. Read the label on the bottle   Gastritis/Heartburn- Omeprazole Take 20mg  Daily for 14 days  Findings:  You may see all of your lab and imaging results utilizing our online portal! Look in this document or ask a team member for your mychart* access information. The most notable results have additionally been verbally communicated with you and your bedside family.    Follow-up Plan:   Follow up with the patient's normal primary care provider for monitoring of this condition within 48 hours.   Signs/Symptoms that would warrant return to the ED:  Please return to the ED if you experience worsening of symptoms or any abrupt changes in your health. Standard of care precautions for your chief complaint have already been verbally communicated with you. Always be on alert for fevers, chills, shortness of breath, chest pains, or sudden changes that warrant immediate evaluation.    Thank you for allowing [place] to be a part of you and your families' care.   Tretha Sciara MD

## 2022-03-14 NOTE — ED Provider Notes (Signed)
[facility] EMERGENCY DEPARTMENT Provider Note   CSN: [id number] Arrival date & time: 03/14/22  1753     History Chief Complaint  Patient presents with   Chest Pain    HPI Crystal Ross is a 57 y.o. female presenting for chest pain. States that approximately 1 week ago she had a left-sided sharp stabbing chest pain.  She denies fevers chills nausea vomiting syncope shortness of breath.  He is otherwise ambulatory tolerating p.o. intake.  Denies any recent travel, pain on inspiration, estrogen product use, history of DVT.  She denies smoking. Endorses a history of hypertension and hyperlipidemia.  Pain currently resolved. No history of similar.  Endorses that her symptoms started after a upper respiratory illness with COVID.  Patient's recorded medical, surgical, social, medication list and allergies were reviewed in the Snapshot window as part of the initial history.   Review of Systems   Review of Systems  Constitutional:  Negative for chills and fever.  HENT:  Negative for ear pain and sore throat.   Eyes:  Negative for pain and visual disturbance.  Respiratory:  Negative for cough and shortness of breath.   Cardiovascular:  Positive for chest pain. Negative for palpitations.  Gastrointestinal:  Negative for abdominal pain and vomiting.  Genitourinary:  Negative for dysuria and hematuria.  Musculoskeletal:  Negative for arthralgias and back pain.  Skin:  Negative for color change and rash.  Neurological:  Negative for seizures and syncope.  All other systems reviewed and are negative.   Physical Exam Updated Vital Signs BP 130/85   Pulse 67   Temp 98 F (36.7 C) (Oral)   Resp 18   Ht 5\' 4"  (1.626 m)   Wt 77.1 kg   SpO2 100%   BMI 29.18 kg/m  Physical Exam Vitals and nursing note reviewed.  Constitutional:      General: She is not in acute distress.    Appearance: She is well-developed.  HENT:     Head: Normocephalic and atraumatic.  Eyes:      Conjunctiva/sclera: Conjunctivae normal.  Cardiovascular:     Rate and Rhythm: Normal rate and regular rhythm.     Heart sounds: No murmur heard. Pulmonary:     Effort: Pulmonary effort is normal. No respiratory distress.     Breath sounds: Normal breath sounds.  Abdominal:     General: There is no distension.     Palpations: Abdomen is soft.     Tenderness: There is no abdominal tenderness. There is no right CVA tenderness or left CVA tenderness.  Musculoskeletal:        General: No swelling or tenderness. Normal range of motion.     Cervical back: Neck supple.  Skin:    General: Skin is warm and dry.  Neurological:     General: No focal deficit present.     Mental Status: She is alert and oriented to person, place, and time. Mental status is at baseline.     Cranial Nerves: No cranial nerve deficit.      ED Course/ Medical Decision Making/ A&P    Procedures Procedures   Medications Ordered in ED Medications - No data to display Medical Decision Making: Crystal Ross is a 57 y.o. female who presented to the ED today with chest pain, detailed above.  Based on patient's comorbidities, patient has a heart score of 2.    Patient's presentation is complicated by their history of multiple comorbid medical problems including hypertension, hyperlipidemia.  Patient placed on  continuous vitals and telemetry monitoring while in ED which was reviewed periodically.  Complete initial physical exam performed, notably the patient was asymptomatic at this time.  Hemodynamically stable in no acute distress.   Reviewed and confirmed nursing documentation for past medical history, family history, social history.    Initial Assessment: With the patient's presentation of left-sided chest pain, most likely diagnosis is musculoskeletal chest pain versus GERD, although ACS remains on the differential. Other diagnoses were considered including (but not limited to) pulmonary embolism,  community-acquired pneumonia, aortic dissection, pneumothorax, underlying bony abnormality, anemia. These are considered less likely due to history of present illness and physical exam findings.    In particular, concerning pulmonary embolism: Patient is without malignancy, recent surgery, history of DVT, or calf tenderness leading to a low risk Wells score. This is most consistent with an acute life/limb threatening illness complicated by underlying chronic conditions.   Initial Plan: Evaluate for ACS with single (due to duration of symptoms greater than 6 hours) troponin and EKG evaluated as below  Evaluate for dissection, bony abnormality, or pneumonia with chest x-ray and screening laboratory evaluation including CBC, BMP  Further evaluation for pulmonary embolism not indicated at this time based on patient's Wells score.  Further evaluation for Thoracic Aortic Dissection not indicated at this time based on patient's clinical history and PE findings.   Initial Study Results: EKG was reviewed independently. Rate, rhythm, axis, intervals all examined and without medically relevant abnormality. ST segments without concerns for elevations.    Laboratory  Single troponin demonstrated NAA   CBC and BMP without obvious metabolic or inflammatory abnormalities requiring further evaluation   Radiology  DG Chest 2 View  Result Date: 03/14/2022 CLINICAL DATA:  Chest pain EXAM: CHEST - 2 VIEW COMPARISON:  05/05/2016 x-ray and CT. More recent x-ray as well 11/19/2019. Other comparison examples as well. FINDINGS: No consolidation, pneumothorax or effusion. Normal cardiopericardial silhouette without edema. Surgical clips in the right upper quadrant of the abdomen. IMPRESSION: No acute cardiopulmonary disease. Electronically Signed   By: Jill Side M.D.   On: 03/14/2022 18:23    Final Assessment and Plan: Patient observed in the emergency department for 4-1/2 hours.  Initial troponin negative, objective  exam grossly reassuring.  Low hear score.  Patient stable for outpatient follow-up with PCP.  Fortunately, symptoms resolved.  Favor costochondritis given patient's description of point tenderness with reproducible pain on palpation prior to arrival.  Also considered gastroesophageal reflux disease less likely given patient's history of similar.  Will recommend trial of omeprazole as well as Tylenol on schedule with plan to follow-up closely with PCP in outpatient setting.  Disposition:  I have considered need for hospitalization, however, considering all of the above, I believe this patient is stable for discharge at this time.  Patient/family educated about specific return precautions for given chief complaint and symptoms.  Patient/family educated about follow-up with PCP.     Patient/family expressed understanding of return precautions and need for follow-up. Patient spoken to regarding all imaging and laboratory results and appropriate follow up for these results. All education provided in verbal form with additional information in written form. Time was allowed for answering of patient questions. Patient discharged.    Emergency Department Medication Summary:   Medications - No data to display               Clinical Impression:  1. Nonspecific chest pain      Discharge   Final Clinical Impression(s) / ED  Diagnoses Final diagnoses:  Nonspecific chest pain    Rx / DC Orders ED Discharge Orders     None         Glyn Ade, MD 03/14/22 2229

## 2022-03-14 NOTE — ED Triage Notes (Signed)
Pt states PCP sent  States left sided chest pain radiating from back that started last week  Denies any pain at this time Denies SOB   Covid on 12/28
